# Patient Record
Sex: Male | Born: 1970 | Race: White | Hispanic: No | Marital: Married | Smoking: Never smoker
Health system: Southern US, Community
[De-identification: ages and names within clinical notes are randomized; demographics above are authoritative.]

## PROBLEM LIST (undated history)

## (undated) DIAGNOSIS — I10 Essential (primary) hypertension: Secondary | ICD-10-CM

---

## 2016-12-16 DIAGNOSIS — Z0000 Encounter for general adult medical examination without abnormal findings: Secondary | ICD-10-CM | POA: Diagnosis not present

## 2017-10-22 ENCOUNTER — Emergency Department
Admission: EM | Admit: 2017-10-22 | Discharge: 2017-10-22 | Disposition: A | Discharge status: Home / Self Care | Payer: 59 | Attending: Emergency Medicine | Admitting: Emergency Medicine

## 2017-10-22 ENCOUNTER — Other Ambulatory Visit: Payer: Self-pay

## 2017-10-22 ENCOUNTER — Emergency Department: Payer: 59

## 2017-10-22 DIAGNOSIS — F17220 Nicotine dependence, chewing tobacco, uncomplicated: Secondary | ICD-10-CM | POA: Insufficient documentation

## 2017-10-22 DIAGNOSIS — I10 Essential (primary) hypertension: Secondary | ICD-10-CM | POA: Insufficient documentation

## 2017-10-22 DIAGNOSIS — R51 Headache: Secondary | ICD-10-CM | POA: Diagnosis present

## 2017-10-22 DIAGNOSIS — R05 Cough: Secondary | ICD-10-CM | POA: Diagnosis not present

## 2017-10-22 HISTORY — DX: Essential (primary) hypertension: I10

## 2017-10-22 LAB — BASIC METABOLIC PANEL
ANION GAP: 12 (ref 5–15)
BUN: 18 mg/dL (ref 6–20)
CHLORIDE: 105 mmol/L (ref 98–111)
CO2: 24 mmol/L (ref 22–32)
Calcium: 9.5 mg/dL (ref 8.9–10.3)
Creatinine, Ser: 0.95 mg/dL (ref 0.61–1.24)
GFR calc non Af Amer: >60 mL/min (ref >60)
GLUCOSE: 104 mg/dL — AB (ref 70–99)
POTASSIUM: 3.9 mmol/L (ref 3.5–5.1)
Sodium: 141 mmol/L (ref 135–145)

## 2017-10-22 LAB — CBC
HEMATOCRIT: 44.8 % (ref 40.0–52.0)
HEMOGLOBIN: 16.1 g/dL (ref 13.0–18.0)
MCH: 32.5 pg (ref 26.0–34.0)
MCHC: 35.9 g/dL (ref 32.0–36.0)
MCV: 90.5 fL (ref 80.0–100.0)
Platelets: 173 10*3/uL (ref 150–440)
RBC: 4.95 MIL/uL (ref 4.40–5.90)
RDW: 13 % (ref 11.5–14.5)
WBC: 6.1 10*3/uL (ref 3.8–10.6)

## 2017-10-22 LAB — TROPONIN I

## 2017-10-22 MED ORDER — AMLODIPINE BESYLATE 5 MG PO TABS: 5.0000 mg | ORAL_TABLET | Freq: Every day | ORAL | 1 refills | Status: AC

## 2017-10-22 MED ORDER — AMLODIPINE BESYLATE 5 MG PO TABS
5.0000 mg | ORAL_TABLET | Freq: Once | ORAL | Status: AC
  Administered 2017-10-22: 5 mg via ORAL
  Filled 2017-10-22: qty 1

## 2017-10-22 NOTE — ED Provider Notes (Signed)
[facility] Emergency Department Provider Note  ____________________________________________  Time seen: Approximately 7:07 PM  I have reviewed the triage vital signs and the nursing notes.   HISTORY  Chief Complaint Hypertension   HPI Jackson Jennings is a 47 y.o. male history of hypertension who presents for evaluation of headache and blurry vision.  Patient reports that he was told by his doctor a year ago on his physical exam that he had elevated blood pressure.  Patient was told to he reports that for the last try lifestyle modifications and was not put on medication.  Few weeks he has had episodes of headache, dizziness and blurry vision.  He takes his blood pressure when these episodes happen and that usually elevated.  Today he was visiting his brother when he had a more severe episode of sudden onset headache with blurry vision and feeling lightheaded.  The wife reports that he was diaphoretic and pale.  They check his blood pressure which was in the 190s.  When patient's symptoms were not improving wife brought him to the emergency room.  No slurred speech, facial droop, unilateral weakness or numbness.  At this time patient reports that all his symptoms have resolved.  He denies headache, blurry vision, dizziness, palpitations, chest pain, shortness of breath, syncope, abdominal pain or back pain.  Past Medical History:  Diagnosis Date  . Hypertension     Prior to Admission medications   Medication Sig Start Date End Date Taking? Authorizing Provider  amLODipine (NORVASC) 5 MG tablet Take 1 tablet (5 mg total) by mouth daily. 10/22/17 10/22/18  Nita Sickle, MD    Allergies Patient has no known allergies.  No family history on file.  Social History Social History   Tobacco Use  . Smoking status: Never Smoker  . Smokeless tobacco: Current User    Types: Snuff  Substance Use Topics  . Alcohol use: Yes  . Drug use: Not on file    Review of  Systems  Constitutional: Negative for fever. + dizziness Eyes: + blurry vision ENT: Negative for sore throat. Neck: No neck pain  Cardiovascular: Negative for chest pain. Respiratory: Negative for shortness of breath. Gastrointestinal: Negative for abdominal pain, vomiting or diarrhea. Genitourinary: Negative for dysuria. Musculoskeletal: Negative for back pain. Skin: Negative for rash. Neurological: + headaches. No weakness or numbness. Psych: No SI or HI  ____________________________________________   PHYSICAL EXAM:  VITAL SIGNS: ED Triage Vitals [10/22/17 1559]  Enc Vitals Group     BP (!) 189/98     Pulse Rate 83     Resp 18     Temp 97.8 F (36.6 C)     Temp Source Oral     SpO2 98 %     Weight 210 lb (95.3 kg)     Height 5\' 10"  (1.778 m)     Head Circumference      Peak Flow      Pain Score 0     Pain Loc      Pain Edu?      Excl. in GC?     Constitutional: Alert and oriented. Well appearing and in no apparent distress. HEENT:      Head: Normocephalic and atraumatic.         Eyes: Conjunctivae are normal. Sclera is non-icteric.       Mouth/Throat: Mucous membranes are moist.       Neck: Supple with no signs of meningismus. Cardiovascular: Regular rate and rhythm. No murmurs, gallops,  or rubs. 2+ symmetrical distal pulses are present in all extremities. No JVD. Respiratory: Normal respiratory effort. Lungs are clear to auscultation bilaterally. No wheezes, crackles, or rhonchi.  Gastrointestinal: Soft, non tender, and non distended with positive bowel sounds. No rebound or guarding. Musculoskeletal: Nontender with normal range of motion in all extremities. No edema, cyanosis, or erythema of extremities. Neurologic: Normal speech and language. A & O x3, PERRL, EOMI, no nystagmus, CN II-XII intact, motor testing reveals good tone and bulk throughout. There is no evidence of pronator drift or dysmetria. Muscle strength is 5/5 throughout.  Sensory examination is  intact. Gait is normal. Skin: Skin is warm, dry and intact. No rash noted. Psychiatric: Mood and affect are normal. Speech and behavior are normal.  ____________________________________________   LABS (all labs ordered are listed, but only abnormal results are displayed)  Labs Reviewed  BASIC METABOLIC PANEL - Abnormal; Notable for the following components:      Result Value   Glucose, Bld 104 (*)    All other components within normal limits  CBC  TROPONIN I   ____________________________________________  EKG  ED ECG REPORT I, Nita Sicklearolina Eleazar Kimmey, the attending physician, personally viewed and interpreted this ECG.  Normal sinus rhythm, rate of 74, normal intervals, normal axis, no ST elevations or depressions.  Normal EKG.  No prior for comparison. ____________________________________________  RADIOLOGY  I have personally reviewed the images performed during this visit and I agree with the Radiologist's read.   Interpretation by Radiologist:  Dg Chest 2 View  Result Date: 10/22/2017 CLINICAL DATA:  Patient reports elevated BP reading today. Denies CP, cough, fever or SOB. Reports recent hx of HTN. Reports he was to meet with a MD later this week in reference to HTN. Non-smoker. EXAM: CHEST - 2 VIEW COMPARISON:  None. FINDINGS: The heart size and mediastinal contours are within normal limits. Both lungs are clear. No pleural effusion or pneumothorax. The visualized skeletal structures are unremarkable. IMPRESSION: Normal chest radiographs. Electronically Signed   By: Amie Portlandavid  Ormond M.D.   On: 10/22/2017 17:06   ____________________________________________   PROCEDURES  Procedure(s) performed: None Procedures Critical Care performed:  None ____________________________________________   INITIAL IMPRESSION / ASSESSMENT AND PLAN / ED COURSE   47 y.o. male history of hypertension who presents for evaluation of headache, dizziness, and blurry vision in the setting of elevated  blood pressure.  At this time patient's blood pressure has improved and is currently 150/86.  His symptoms have fully resolved.  He is completely neurologically intact with no evidence of stroke.  No endorgan injury seen on labs, and EKG.  Since patient has had elevated blood pressure for a year now I will start him on amlodipine 5 mg.  He has an appointment with his primary care doctor in 3 days.  Recommended him keeping a blood pressure diary so his physician can make any adjustments to the medication as needed on Wednesday.  Discussed strict return precautions for any signs of stroke, chest pain, syncope.      As part of my medical decision making, I reviewed the following data within the electronic MEDICAL RECORD NUMBER Nursing notes reviewed and incorporated, Labs reviewed , EKG interpreted , Old chart reviewed, Notes from prior ED visits and [place] Controlled Substance Database    Pertinent labs & imaging results that were available during my care of the patient were reviewed by me and considered in my medical decision making (see chart for details).    ____________________________________________  FINAL CLINICAL IMPRESSION(S) / ED DIAGNOSES  Final diagnoses:  Essential hypertension      NEW MEDICATIONS STARTED DURING THIS VISIT:  ED Discharge Orders        Ordered    amLODipine (NORVASC) 5 MG tablet  Daily     10/22/17 1924       Note:  This document was prepared using Dragon voice recognition software and may include unintentional dictation errors.    Nita Sickle, MD 10/22/17 [phone]

## 2017-10-22 NOTE — ED Triage Notes (Signed)
Pt was told a year ago to try diet for HTN. Has appt with PCP about BP soon but past few days have been c/o of HA, dizziness, blurred vision. BP at home with 190/110, after relaxing a bit was 160/90. Alert, oriented, ambulatory. No distress noted.

## 2017-10-22 NOTE — ED Notes (Signed)
Patient transported to X-ray 

## 2017-10-25 DIAGNOSIS — I10 Essential (primary) hypertension: Secondary | ICD-10-CM | POA: Diagnosis not present

## 2018-01-25 DIAGNOSIS — I10 Essential (primary) hypertension: Secondary | ICD-10-CM | POA: Diagnosis not present

## 2018-01-25 DIAGNOSIS — E781 Pure hyperglyceridemia: Secondary | ICD-10-CM | POA: Diagnosis not present

## 2018-08-23 IMAGING — CR DG CHEST 2V
1 series · 2 of 2 positions shown · non-contrast
Comparison: None.

CLINICAL DATA: Patient reports elevated BP reading today. Denies
CP, cough, fever or SOB. Reports recent hx of HTN. Reports he was to
meet with Byron Rodolfo Huox later this week in reference to HTN. Non-smoker.

EXAM:
CHEST - 2 VIEW

[Series 1: dg chest 2 view · 0.14mm/px · 2 of 2 slices shown]
[im 1/2]
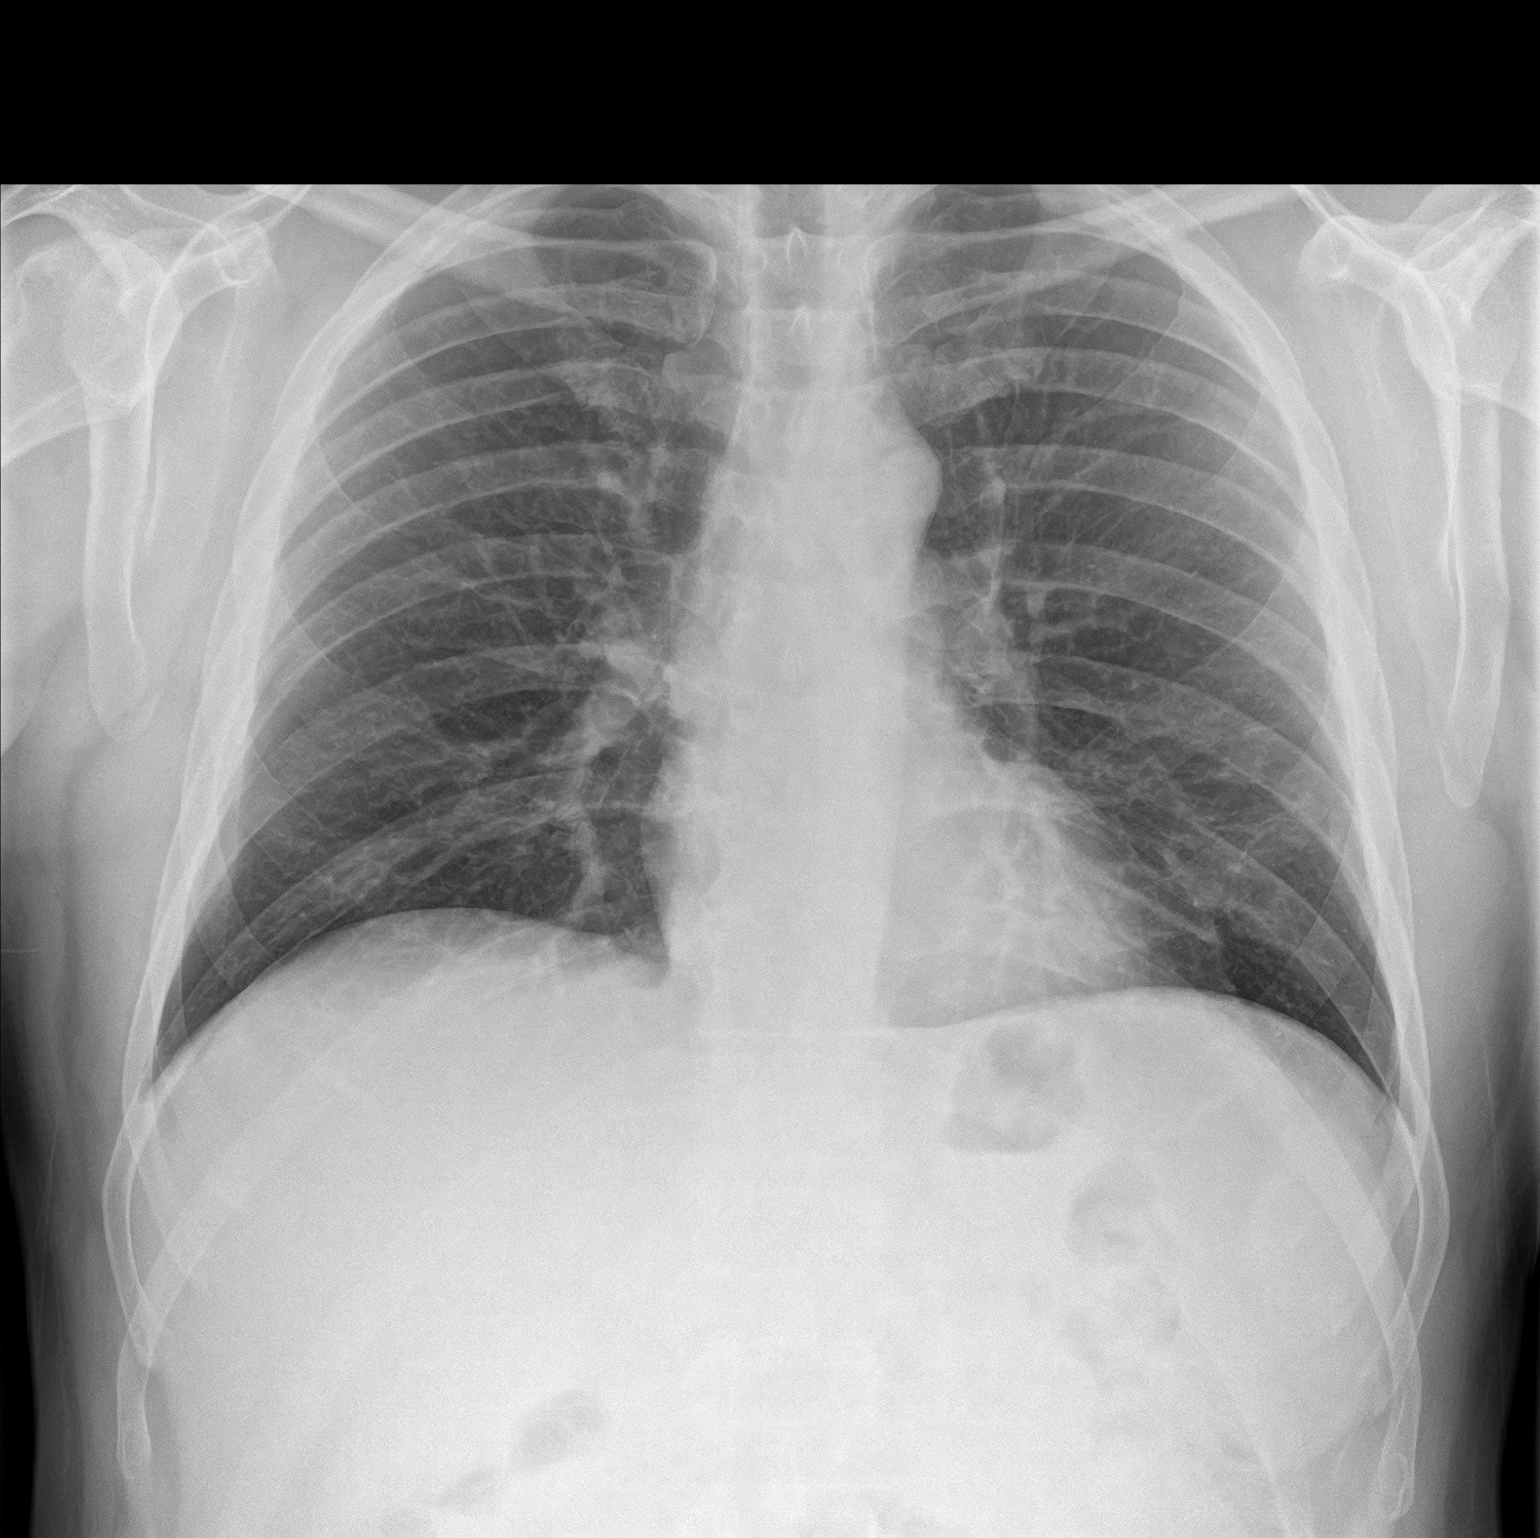
[im 2/2]
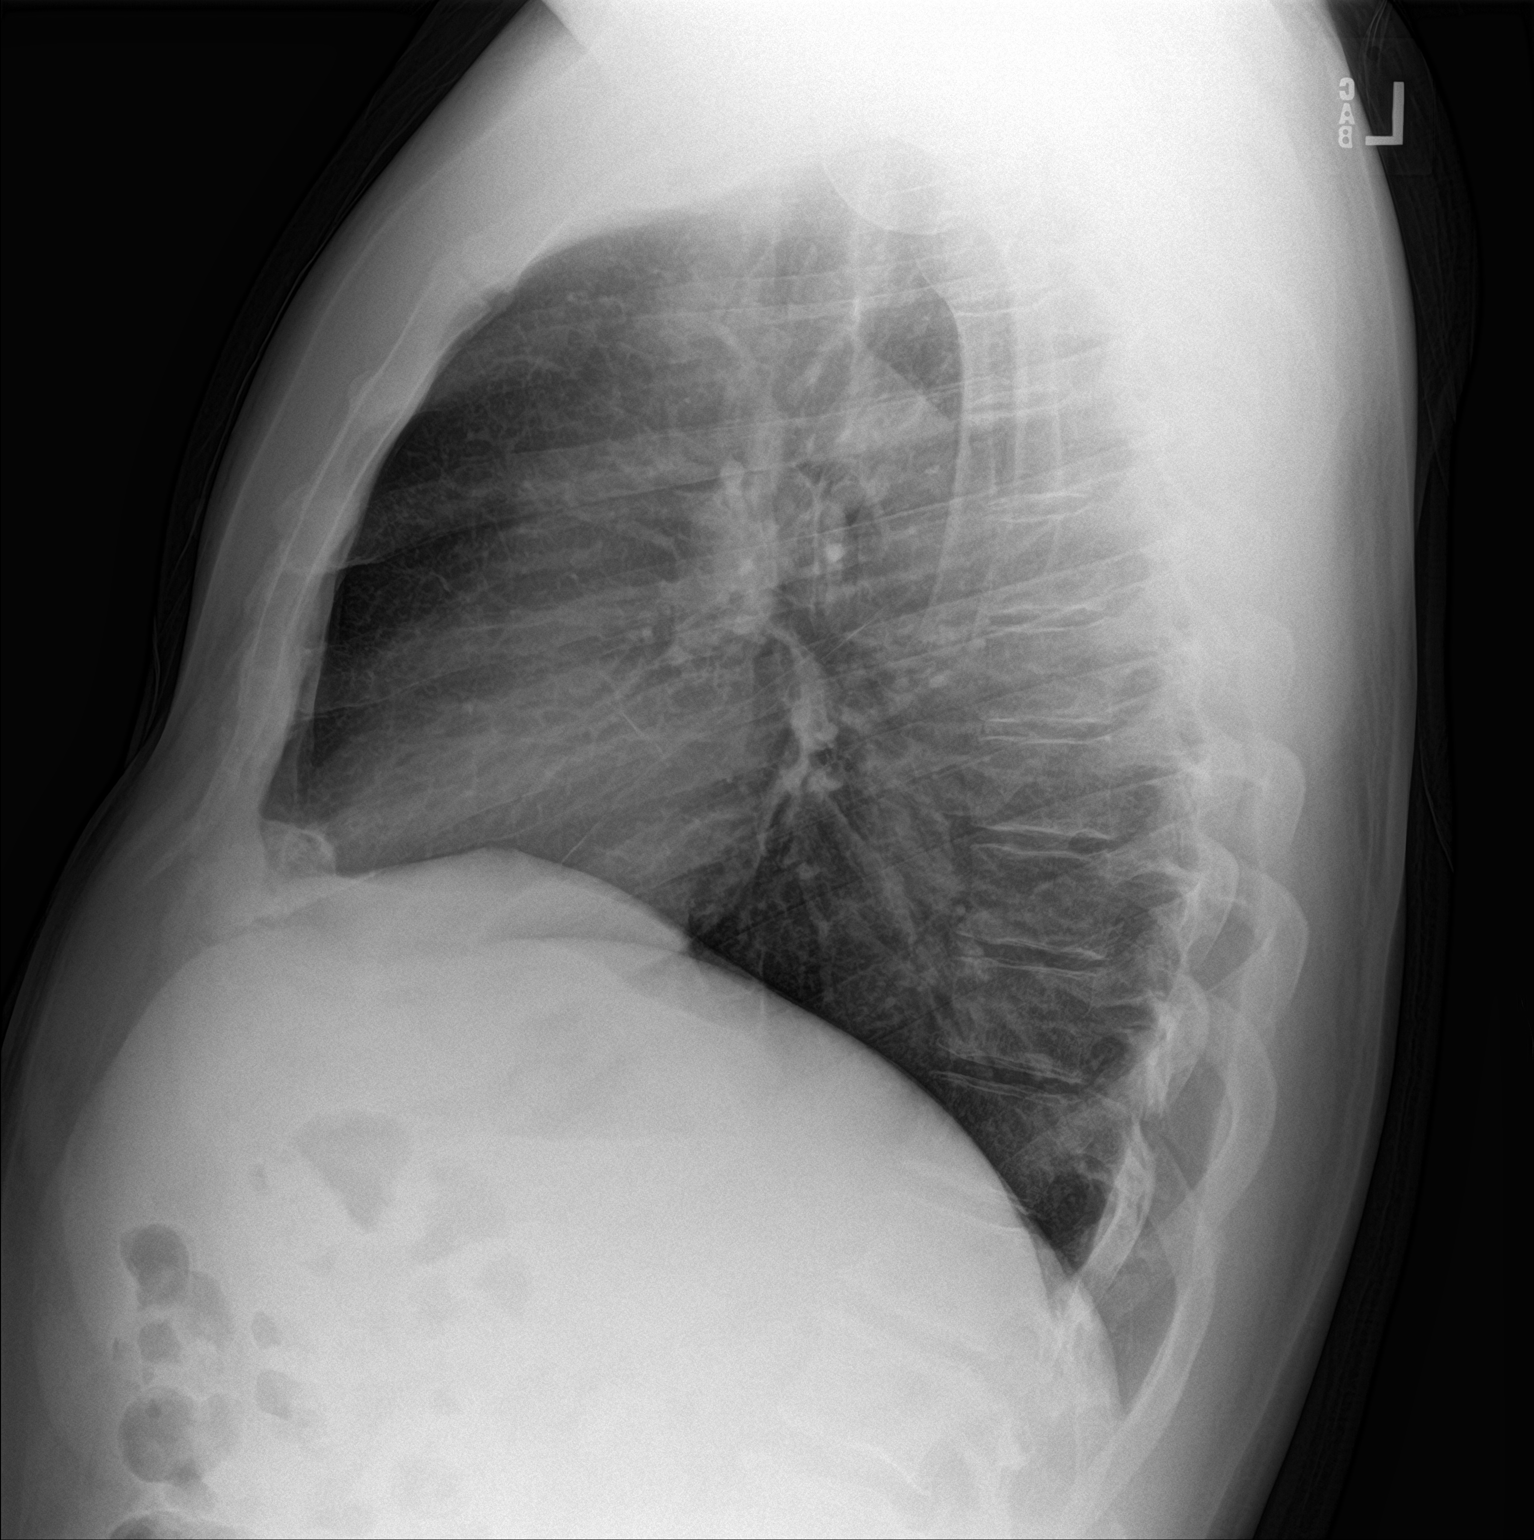

[2 of 2 positions shown; findings below may reference images not displayed]

FINDINGS: The heart size and mediastinal contours are within normal limits.
Both lungs are clear. No pleural effusion or pneumothorax. The
visualized skeletal structures are unremarkable.
IMPRESSION: Normal chest radiographs.
